# Patient Record
Sex: Male | Born: 1995
Health system: Southern US, Community
[De-identification: ages and names within clinical notes are randomized; demographics above are authoritative.]

## PROBLEM LIST (undated history)

## (undated) DIAGNOSIS — B2790 Infectious mononucleosis, unspecified without complication: Secondary | ICD-10-CM

## (undated) DIAGNOSIS — J3501 Chronic tonsillitis: Secondary | ICD-10-CM

## (undated) HISTORY — PX: NO PAST SURGERIES: SHX2092

---
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
## 2017-01-01 DIAGNOSIS — J310 Chronic rhinitis: Secondary | ICD-10-CM | POA: Diagnosis not present

## 2017-01-01 DIAGNOSIS — J342 Deviated nasal septum: Secondary | ICD-10-CM | POA: Diagnosis not present

## 2017-01-01 DIAGNOSIS — Q219 Congenital malformation of cardiac septum, unspecified: Secondary | ICD-10-CM | POA: Diagnosis not present

## 2017-01-01 DIAGNOSIS — J343 Hypertrophy of nasal turbinates: Secondary | ICD-10-CM | POA: Diagnosis not present

## 2017-01-08 ENCOUNTER — Ambulatory Visit: Payer: BLUE CROSS/BLUE SHIELD | Admitting: Physician Assistant

## 2017-01-08 VITALS — BP 124/72 | HR 95 | Temp 97.7°F | Resp 16 | Wt 184.0 lb

## 2017-01-08 DIAGNOSIS — J019 Acute sinusitis, unspecified: Secondary | ICD-10-CM

## 2017-01-08 MED ORDER — AMOXICILLIN-POT CLAVULANATE 875-125 MG PO TABS: 1.0000 | ORAL_TABLET | Freq: Two times a day (BID) | ORAL | 0 refills | Status: AC

## 2017-01-08 NOTE — Progress Notes (Signed)
Patient: Jonathan DegreeDustin Leyba Male    DOB: 02/19/1995   21 y.o.   MRN: [id number] Visit Date: 01/13/2017  Today's Provider: Trey SailorsAdriana M Susumu Hackler, PA-C   Chief Complaint  Patient presents with  . Cough   Subjective:    HPI Pt is her today for cough, congestion, mouth sore. He reports that a week and a half he had sinus [facility] Chestine Sporelark. He was doing great until last night. He reports that he had not changed anything and he suddenly had hives every where his feet and hands were swollen and he was itching. He took Benadryl and it went away.Yesterday he had a cold sore come up in his mouth and he has never had these before. He reports that he is also coughing up a lot of green mucus and he has a infection taste in his mouth. They called his ENT office and they told him to come see his PCP to rule out something not related to the surgery. Pt reports that just before the surgery he had a cold but he was still ok to have surgery per the surgeon.Pt thinks he has an injfection some where.       No Known Allergies   Current Outpatient Medications:  .  fluticasone (FLONASE) 50 MCG/ACT nasal spray, Place 2 sprays into both nostrils daily., Disp: , Rfl:  .  amoxicillin-clavulanate (AUGMENTIN) 875-125 MG tablet, Take 1 tablet by mouth 2 (two) times daily for 10 days., Disp: 20 tablet, Rfl: 0 .  azithromycin (ZITHROMAX) 250 MG tablet, 2 by mouth today, then 1 daily for 4 days, Disp: 6 tablet, Rfl: 0  Review of Systems  Constitutional: Positive for fatigue.  HENT: Positive for congestion and postnasal drip.   Eyes: Negative.   Respiratory: Positive for cough.   Cardiovascular: Negative.   Gastrointestinal: Negative.   Endocrine: Negative.   Genitourinary: Negative.   Musculoskeletal: Negative.   Skin: Positive for rash.  Allergic/Immunologic: Negative.   Neurological: Negative.   Hematological: Negative.   Psychiatric/Behavioral: Negative.     Social History    Tobacco Use  . Smoking status: Never Smoker  . Smokeless tobacco: Never Used  Substance Use Topics  . Alcohol use: No   Objective:   BP 124/72 (BP Location: Left Arm, Patient Position: Sitting, Cuff Size: Normal)   Pulse 95   Temp 97.7 F (36.5 C) (Oral)   Resp 16   Wt 184 lb (83.5 kg)   SpO2 97%   BMI 23.31 kg/m  Vitals:   01/08/17 1619  BP: 124/72  Pulse: 95  Resp: 16  Temp: 97.7 F (36.5 C)  TempSrc: Oral  SpO2: 97%  Weight: 184 lb (83.5 kg)     Physical Exam  Constitutional: He appears well-developed and well-nourished.  HENT:  Right Ear: Tympanic membrane and external ear normal.  Left Ear: Tympanic membrane and external ear normal.  Nose: Mucosal edema present. No rhinorrhea. No epistaxis.  Mouth/Throat: Oropharynx is clear and moist. No oropharyngeal exudate.  There is a small aphthous ulcer on inside of inferior lip. Patient still has bandages from surgery. Nasal turbinates appear edematous but no signs of rhinorrhea or epistaxis.   Eyes: Conjunctivae are normal.  Neck: Neck supple.  Cardiovascular: Normal rate and regular rhythm.  Pulmonary/Chest: Effort normal and breath sounds normal.  Lymphadenopathy:    He has no cervical adenopathy.        Assessment & Plan:  1. Acute non-recurrent sinusitis, unspecified location  Have reviewed chart and he has tolerated Augmentin previously. Will treat as sinusitis 2/2 recent surgery. He has follow up with ENT on 01/15/2017.   - amoxicillin-clavulanate (AUGMENTIN) 875-125 MG tablet; Take 1 tablet by mouth 2 (two) times daily for 10 days.  Dispense: 20 tablet; Refill: 0  Return if symptoms worsen or fail to improve.  The entirety of the information documented in the History of Present Illness, Review of Systems and Physical Exam were personally obtained by me. Portions of this information were initially documented by Kavin LeechLaura Walsh, CMA and reviewed by me for thoroughness and accuracy.         Trey SailorsAdriana M  Shanyia Stines, PA-C  [facility]

## 2017-01-08 NOTE — Patient Instructions (Signed)

## 2017-01-09 ENCOUNTER — Telehealth: Payer: Self-pay | Admitting: Family Medicine

## 2017-01-09 MED ORDER — AZITHROMYCIN 250 MG PO TABS: ORAL_TABLET | ORAL | 0 refills | Status: AC

## 2017-01-09 NOTE — Telephone Encounter (Signed)
There is a rare interaction between amoxicillin and some respiratory viruses that can cause rash. He is probably not allergic to amoxicillin. Can change to azithromycin, have sent prescription to [facility].

## 2017-01-09 NOTE — Telephone Encounter (Signed)
Pt's mother Vanetta MuldersLeslie Kirkes called stating that pt was prescribed amoxicillin yesterday and within 2 hours of taking it he had red,blotchy spots on skin.She [facility] [address]Their phone # is [phone]. He has done well with penicillin in past.Her call back # is [phone].Please call when this has been done

## 2017-01-09 NOTE — Telephone Encounter (Signed)
Spoke with mother and advised as below-Anastasiya V Hopkins, RMA

## 2017-01-09 NOTE — Telephone Encounter (Signed)
Please advise-Usher Hedberg V Darnell Jeschke, RMA  

## 2017-01-29 DIAGNOSIS — F4325 Adjustment disorder with mixed disturbance of emotions and conduct: Secondary | ICD-10-CM | POA: Diagnosis not present

## 2017-02-05 DIAGNOSIS — F4325 Adjustment disorder with mixed disturbance of emotions and conduct: Secondary | ICD-10-CM | POA: Diagnosis not present

## 2017-02-08 DIAGNOSIS — Z1283 Encounter for screening for malignant neoplasm of skin: Secondary | ICD-10-CM | POA: Diagnosis not present

## 2017-02-08 DIAGNOSIS — D225 Melanocytic nevi of trunk: Secondary | ICD-10-CM | POA: Diagnosis not present

## 2017-02-08 DIAGNOSIS — D485 Neoplasm of uncertain behavior of skin: Secondary | ICD-10-CM | POA: Diagnosis not present

## 2017-02-08 DIAGNOSIS — D2239 Melanocytic nevi of other parts of face: Secondary | ICD-10-CM | POA: Diagnosis not present

## 2017-02-08 DIAGNOSIS — L820 Inflamed seborrheic keratosis: Secondary | ICD-10-CM | POA: Diagnosis not present

## 2017-02-08 DIAGNOSIS — D2260 Melanocytic nevi of unspecified upper limb, including shoulder: Secondary | ICD-10-CM | POA: Diagnosis not present

## 2017-05-14 ENCOUNTER — Ambulatory Visit: Payer: BLUE CROSS/BLUE SHIELD | Admitting: Family Medicine

## 2017-09-01 DIAGNOSIS — D225 Melanocytic nevi of trunk: Secondary | ICD-10-CM | POA: Diagnosis not present

## 2017-09-01 DIAGNOSIS — L821 Other seborrheic keratosis: Secondary | ICD-10-CM | POA: Diagnosis not present

## 2017-09-01 DIAGNOSIS — Z1283 Encounter for screening for malignant neoplasm of skin: Secondary | ICD-10-CM | POA: Diagnosis not present

## 2017-09-01 DIAGNOSIS — D485 Neoplasm of uncertain behavior of skin: Secondary | ICD-10-CM | POA: Diagnosis not present

## 2017-09-01 DIAGNOSIS — D2230 Melanocytic nevi of unspecified part of face: Secondary | ICD-10-CM | POA: Diagnosis not present

## 2017-09-01 DIAGNOSIS — D2260 Melanocytic nevi of unspecified upper limb, including shoulder: Secondary | ICD-10-CM | POA: Diagnosis not present

## 2017-10-13 ENCOUNTER — Encounter: Payer: Self-pay | Admitting: Family Medicine

## 2017-10-13 ENCOUNTER — Ambulatory Visit (INDEPENDENT_AMBULATORY_CARE_PROVIDER_SITE_OTHER): Payer: BLUE CROSS/BLUE SHIELD | Admitting: Family Medicine

## 2017-10-13 VITALS — BP 110/62 | HR 80 | Temp 98.3°F | Resp 16 | Wt 182.0 lb

## 2017-10-13 DIAGNOSIS — H6690 Otitis media, unspecified, unspecified ear: Secondary | ICD-10-CM | POA: Diagnosis not present

## 2017-10-13 MED ORDER — AZITHROMYCIN 250 MG PO TABS: ORAL_TABLET | ORAL | 0 refills | Status: AC

## 2017-10-13 NOTE — Progress Notes (Signed)
       Patient: Jonathan Barber Male    DOB: 18-Oct-1995   22 y.o.   MRN: [id number] Visit Date: 10/13/2017  Today's Provider: Megan Mans, MD   Chief Complaint  Patient presents with  . Sinusitis   Subjective:    Sinusitis  This is a new problem. The current episode started in the past 7 days (3 days). The problem has been gradually worsening since onset. Associated symptoms include congestion, coughing, headaches, sinus pressure and a sore throat. Past treatments include oral decongestants. The treatment provided no relief.  Pt is worried about mumps.     No Known Allergies   Current Outpatient Medications:  .  fluticasone (FLONASE) 50 MCG/ACT nasal spray, Place 2 sprays into both nostrils daily., Disp: , Rfl:   Review of Systems  HENT: Positive for congestion, sinus pressure and sore throat.   Eyes: Negative.   Respiratory: Positive for cough.   Endocrine: Negative.   Allergic/Immunologic: Negative.   Neurological: Positive for headaches.  Psychiatric/Behavioral: Negative.     Social History   Tobacco Use  . Smoking status: Never Smoker  . Smokeless tobacco: Never Used  Substance Use Topics  . Alcohol use: No   Objective:   BP 110/62 (BP Location: Left Arm, Patient Position: Sitting, Cuff Size: Normal)   Pulse 80   Temp 98.3 F (36.8 C)   Resp 16   Wt 182 lb (82.6 kg)   SpO2 99%   BMI 23.05 kg/m  Vitals:   10/13/17 1520  BP: 110/62  Pulse: 80  Resp: 16  Temp: 98.3 F (36.8 C)  SpO2: 99%  Weight: 182 lb (82.6 kg)     Physical Exam  Constitutional: He is oriented to person, place, and time. He appears well-developed and well-nourished.  HENT:  Head: Normocephalic and atraumatic.  Right Ear: External ear normal.  Left Ear: External ear normal.  Nose: Nose normal.  Mouth/Throat: Oropharynx is clear and moist. No oropharyngeal exudate.  Left OM.  Eyes: Conjunctivae are normal. No scleral icterus.  Neck: No thyromegaly present.    Cardiovascular: Normal rate, regular rhythm and normal heart sounds.  Pulmonary/Chest: Effort normal and breath sounds normal.  Abdominal: Soft.  Musculoskeletal: He exhibits no edema.  Neurological: He is alert and oriented to person, place, and time.  Skin: Skin is warm and dry.  Psychiatric: He has a normal mood and affect. His behavior is normal. Judgment and thought content normal.        Assessment & Plan:     1. Acute otitis media, unspecified otitis media type  - azithromycin (ZITHROMAX) 250 MG tablet; Take 2 tablets today, then 1 tablet daily thereafter.  Dispense: 6 tablet; Refill: 0      I have done the exam and reviewed the above chart and it is accurate to the best of my knowledge. Dentist has been used in this note in any air is in the dictation or transcription are unintentional.  Megan Mans, MD  [facility]

## 2018-04-12 DIAGNOSIS — Z1283 Encounter for screening for malignant neoplasm of skin: Secondary | ICD-10-CM | POA: Diagnosis not present

## 2018-04-12 DIAGNOSIS — D229 Melanocytic nevi, unspecified: Secondary | ICD-10-CM | POA: Diagnosis not present

## 2018-04-12 DIAGNOSIS — L578 Other skin changes due to chronic exposure to nonionizing radiation: Secondary | ICD-10-CM | POA: Diagnosis not present

## 2018-04-12 DIAGNOSIS — D485 Neoplasm of uncertain behavior of skin: Secondary | ICD-10-CM | POA: Diagnosis not present

## 2018-04-12 DIAGNOSIS — D225 Melanocytic nevi of trunk: Secondary | ICD-10-CM | POA: Diagnosis not present

## 2018-04-12 DIAGNOSIS — L700 Acne vulgaris: Secondary | ICD-10-CM | POA: Diagnosis not present

## 2018-08-05 DIAGNOSIS — Z20828 Contact with and (suspected) exposure to other viral communicable diseases: Secondary | ICD-10-CM | POA: Diagnosis not present

## 2018-10-12 ENCOUNTER — Ambulatory Visit: Payer: BLUE CROSS/BLUE SHIELD

## 2018-10-12 ENCOUNTER — Ambulatory Visit: Payer: BC Managed Care – PPO

## 2018-10-12 ENCOUNTER — Other Ambulatory Visit: Payer: Self-pay

## 2018-10-12 DIAGNOSIS — Z23 Encounter for immunization: Secondary | ICD-10-CM

## 2018-10-19 DIAGNOSIS — Z1283 Encounter for screening for malignant neoplasm of skin: Secondary | ICD-10-CM | POA: Diagnosis not present

## 2018-10-19 DIAGNOSIS — D485 Neoplasm of uncertain behavior of skin: Secondary | ICD-10-CM | POA: Diagnosis not present

## 2018-10-19 DIAGNOSIS — D2230 Melanocytic nevi of unspecified part of face: Secondary | ICD-10-CM | POA: Diagnosis not present

## 2018-10-19 DIAGNOSIS — D2272 Melanocytic nevi of left lower limb, including hip: Secondary | ICD-10-CM | POA: Diagnosis not present

## 2018-10-19 DIAGNOSIS — L578 Other skin changes due to chronic exposure to nonionizing radiation: Secondary | ICD-10-CM | POA: Diagnosis not present

## 2018-10-19 DIAGNOSIS — D2262 Melanocytic nevi of left upper limb, including shoulder: Secondary | ICD-10-CM | POA: Diagnosis not present

## 2018-10-19 DIAGNOSIS — D2271 Melanocytic nevi of right lower limb, including hip: Secondary | ICD-10-CM | POA: Diagnosis not present

## 2018-10-19 DIAGNOSIS — Z86018 Personal history of other benign neoplasm: Secondary | ICD-10-CM | POA: Diagnosis not present

## 2018-10-20 DIAGNOSIS — Z20828 Contact with and (suspected) exposure to other viral communicable diseases: Secondary | ICD-10-CM | POA: Diagnosis not present

## 2018-11-09 DIAGNOSIS — Z20828 Contact with and (suspected) exposure to other viral communicable diseases: Secondary | ICD-10-CM | POA: Diagnosis not present

## 2018-11-24 DIAGNOSIS — Z20828 Contact with and (suspected) exposure to other viral communicable diseases: Secondary | ICD-10-CM | POA: Diagnosis not present

## 2018-12-12 ENCOUNTER — Other Ambulatory Visit: Payer: Self-pay

## 2018-12-12 DIAGNOSIS — Z20822 Contact with and (suspected) exposure to covid-19: Secondary | ICD-10-CM

## 2018-12-13 LAB — NOVEL CORONAVIRUS, NAA: SARS-CoV-2, NAA: NOT DETECTED

## 2018-12-19 DIAGNOSIS — Z20828 Contact with and (suspected) exposure to other viral communicable diseases: Secondary | ICD-10-CM | POA: Diagnosis not present

## 2019-01-05 ENCOUNTER — Ambulatory Visit: Payer: BC Managed Care – PPO | Attending: Internal Medicine

## 2019-01-05 DIAGNOSIS — Z20822 Contact with and (suspected) exposure to covid-19: Secondary | ICD-10-CM

## 2019-01-05 DIAGNOSIS — Z20828 Contact with and (suspected) exposure to other viral communicable diseases: Secondary | ICD-10-CM | POA: Diagnosis not present

## 2019-01-07 LAB — NOVEL CORONAVIRUS, NAA: SARS-CoV-2, NAA: NOT DETECTED

## 2019-05-18 DIAGNOSIS — Z23 Encounter for immunization: Secondary | ICD-10-CM | POA: Diagnosis not present

## 2019-06-08 DIAGNOSIS — Z23 Encounter for immunization: Secondary | ICD-10-CM | POA: Diagnosis not present
